# Patient Record
Sex: Male | Born: 1995 | Race: White | Hispanic: No | Marital: Single | Smoking: Never smoker
Health system: Southern US, Community
[De-identification: ages and names within clinical notes are randomized; demographics above are authoritative.]

## PROBLEM LIST (undated history)

## (undated) DIAGNOSIS — J45909 Unspecified asthma, uncomplicated: Secondary | ICD-10-CM

## (undated) HISTORY — PX: FOOT SURGERY: SHX648

## (undated) HISTORY — PX: WISDOM TOOTH EXTRACTION: SHX21

---
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
## 2015-03-09 ENCOUNTER — Emergency Department (INDEPENDENT_AMBULATORY_CARE_PROVIDER_SITE_OTHER)
Admission: EM | Admit: 2015-03-09 | Discharge: 2015-03-09 | Disposition: A | Discharge status: Home / Self Care | Payer: Self-pay | Source: Home / Self Care | Attending: Emergency Medicine | Admitting: Emergency Medicine

## 2015-03-09 ENCOUNTER — Encounter (HOSPITAL_COMMUNITY): Payer: Self-pay | Admitting: Emergency Medicine

## 2015-03-09 DIAGNOSIS — S161XXA Strain of muscle, fascia and tendon at neck level, initial encounter: Secondary | ICD-10-CM

## 2015-03-09 DIAGNOSIS — S060X0A Concussion without loss of consciousness, initial encounter: Secondary | ICD-10-CM

## 2015-03-09 MED ORDER — CYCLOBENZAPRINE HCL 10 MG PO TABS: 10.0000 mg | ORAL_TABLET | Freq: Three times a day (TID) | ORAL | Status: AC | PRN

## 2015-03-09 MED ORDER — IBUPROFEN 600 MG PO TABS: 600.0000 mg | ORAL_TABLET | Freq: Four times a day (QID) | ORAL | Status: AC | PRN
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
## 2015-03-09 NOTE — Discharge Instructions (Signed)
You have strained the muscles in your neck and throat. This is why it hurts when you swallow or move your head. Alternate ice and heat to the neck at least 3 times a day. Take ibuprofen 600 mg every 6 hours for the next 2 days. Use the Flexeril 3 times a day as needed for muscle spasms. This medicine will make you drowsy. It will likely take 2 weeks for your neck go back to normal.  If your balance has not returned to normal in 24 hours, please go to the emergency room for additional evaluation.
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
## 2015-10-09 ENCOUNTER — Ambulatory Visit (HOSPITAL_COMMUNITY)
Admission: EM | Admit: 2015-10-09 | Discharge: 2015-10-09 | Disposition: A | Discharge status: Home / Self Care | Payer: BLUE CROSS/BLUE SHIELD | Attending: Internal Medicine | Admitting: Internal Medicine

## 2015-10-09 ENCOUNTER — Encounter (HOSPITAL_COMMUNITY): Payer: Self-pay | Admitting: Emergency Medicine

## 2015-10-09 DIAGNOSIS — R110 Nausea: Secondary | ICD-10-CM

## 2015-10-09 DIAGNOSIS — K2970 Gastritis, unspecified, without bleeding: Secondary | ICD-10-CM | POA: Diagnosis not present

## 2015-10-09 MED ORDER — ONDANSETRON HCL 4 MG PO TABS: 4.0000 mg | ORAL_TABLET | Freq: Four times a day (QID) | ORAL | 0 refills | Status: DC

## 2015-10-09 NOTE — ED Triage Notes (Signed)
Pt. Stated, I've had cold sweats, headache, diarrhea, weakness,  and I think its happened because I was cleaning out my vapor with alcohol.  Im no sure,

## 2015-10-10 NOTE — ED Provider Notes (Signed)
CSN: [id number]     Arrival date & time 10/09/15  1916 History   First MD Initiated Contact with Patient 10/09/15 2058     Chief Complaint  Patient presents with  . Dizziness  . Excessive Sweating  . Diarrhea   (Consider location/radiation/quality/duration/timing/severity/associated sxs/prior Treatment) HPI 20 y/o male states he was "vaping" Friday night and inhaled salt from the cleaning solution. He states that he has an upset stomach with diarrhea for the last 24 hours. Girlfriend looked on Internet and they are concerned that he is salt overloaded at this time. Worried that he could have brain swelling. Anemia and other issues. Taking fluids and eating, but feels nauseated. 2 episodes of diarrhea today. Felt dizzy for a few minutes this morning when he got up. But better at this time.  Past Medical History:  Diagnosis Date  . Asthma    History reviewed. No pertinent surgical history. No family history on file. Social History  Substance Use Topics  . Smoking status: Never Smoker  . Smokeless tobacco: Never Used  . Alcohol use Yes    Review of Systems  Denies: HEADACHE, NAUSEA, ABDOMINAL PAIN, CHEST PAIN, CONGESTION, DYSURIA, SHORTNESS OF BREATH  Allergies  Oxycodone-acetaminophen; Tramadol; and Vicodin [hydrocodone-acetaminophen]  Home Medications   Prior to Admission medications   Medication Sig Start Date End Date Taking? Authorizing Provider  albuterol (PROVENTIL HFA;VENTOLIN HFA) 108 (90 BASE) MCG/ACT inhaler Inhale 2 puffs into the lungs every 6 (six) hours as needed for wheezing. 10/07/11   Ozella Rocksavid J Merrell, MD  cyclobenzaprine (FLEXERIL) 10 MG tablet Take 1 tablet (10 mg total) by mouth 3 (three) times daily as needed for muscle spasms. 03/09/15   Charm RingsErin J Honig, MD  ibuprofen (ADVIL,MOTRIN) 600 MG tablet Take 1 tablet (600 mg total) by mouth every 6 (six) hours as needed for moderate pain. 03/09/15   Charm RingsErin J Honig, MD  omeprazole (PRILOSEC) 40 MG capsule Take 1 capsule (40  mg total) by mouth daily. 10/07/11   Ozella Rocksavid J Merrell, MD  ondansetron (ZOFRAN) 4 MG tablet Take 1 tablet (4 mg total) by mouth every 6 (six) hours. 10/09/15   Tharon AquasFrank C Peighton Edgin, PA  Spacer/Aero-Holding Chambers DEVI 1 each by Does not apply route as directed. 10/07/11   Ozella Rocksavid J Merrell, MD   Meds Ordered and Administered this Visit  Medications - No data to display  BP 138/88 (BP Location: Left Arm)   Pulse 83   Temp 98.1 F (36.7 C) (Oral)   Resp 18   SpO2 100%  No data found.   Physical Exam NURSES NOTES AND VITAL SIGNS REVIEWED. CONSTITUTIONAL: Well developed, well nourished, no acute distress HEENT: normocephalic, atraumatic EYES: Conjunctiva normal NECK:normal ROM, supple, no adenopathy PULMONARY:No respiratory distress, normal effort ABDOMINAL: Soft, ND, NT BS+, No CVAT MUSCULOSKELETAL: Normal ROM of all extremities,  SKIN: warm and dry without rash PSYCHIATRIC: Mood and affect, behavior are normal  Urgent Care Course   Clinical Course   Long discussion with patient that he does not need to be worried at this time of hypernatremia. He has no neurological or physical signs of hypernatremia. Advised he most likely has a tummy bug and he will get better.  Procedures (including critical care time)  Labs Review Labs Reviewed - No data to display  Imaging Review No results found.   Visual Acuity Review  Right Eye Distance:   Left Eye Distance:   Bilateral Distance:    Right Eye Near:   Left Eye Near:  Bilateral Near:         MDM   1. Gastritis   2. Nausea      Patient is reassured that there are no issues that require transfer to higher level of care at this time or additional tests. Patient is advised to continue home symptomatic treatment. Patient is advised that if there are new or worsening symptoms to attend the emergency department, contact primary care provider, or return to UC. Instructions of care provided discharged home in stable  condition.    THIS NOTE WAS GENERATED USING A VOICE RECOGNITION SOFTWARE PROGRAM. ALL REASONABLE EFFORTS  WERE MADE TO PROOFREAD THIS DOCUMENT FOR ACCURACY.  I have verbally reviewed the discharge instructions with the patient. A printed AVS was given to the patient.  All questions were answered prior to discharge.      Tharon Aquas, PA 10/10/15 1103

## 2016-01-28 ENCOUNTER — Encounter (HOSPITAL_COMMUNITY): Payer: Self-pay

## 2016-01-28 ENCOUNTER — Emergency Department (HOSPITAL_COMMUNITY): Payer: BLUE CROSS/BLUE SHIELD

## 2016-01-28 ENCOUNTER — Emergency Department (HOSPITAL_COMMUNITY)
Admission: EM | Admit: 2016-01-28 | Discharge: 2016-01-28 | Disposition: A | Discharge status: Home / Self Care | Payer: BLUE CROSS/BLUE SHIELD | Attending: Emergency Medicine | Admitting: Emergency Medicine

## 2016-01-28 DIAGNOSIS — E860 Dehydration: Secondary | ICD-10-CM

## 2016-01-28 DIAGNOSIS — F419 Anxiety disorder, unspecified: Secondary | ICD-10-CM | POA: Diagnosis not present

## 2016-01-28 DIAGNOSIS — R42 Dizziness and giddiness: Secondary | ICD-10-CM | POA: Diagnosis present

## 2016-01-28 DIAGNOSIS — Z79899 Other long term (current) drug therapy: Secondary | ICD-10-CM | POA: Diagnosis not present

## 2016-01-28 DIAGNOSIS — J45909 Unspecified asthma, uncomplicated: Secondary | ICD-10-CM | POA: Diagnosis not present

## 2016-01-28 LAB — CBC
HEMATOCRIT: 48.4 % (ref 39.0–52.0)
Hemoglobin: 16.9 g/dL (ref 13.0–17.0)
MCH: 31.1 pg (ref 26.0–34.0)
MCHC: 34.9 g/dL (ref 30.0–36.0)
MCV: 89 fL (ref 78.0–100.0)
PLATELETS: 292 10*3/uL (ref 150–400)
RBC: 5.44 MIL/uL (ref 4.22–5.81)
RDW: 12.6 % (ref 11.5–15.5)
WBC: 11.8 10*3/uL — AB (ref 4.0–10.5)

## 2016-01-28 LAB — BASIC METABOLIC PANEL
Anion gap: 13 (ref 5–15)
CO2: 23 mmol/L (ref 22–32)
CREATININE: 0.98 mg/dL (ref 0.61–1.24)
Calcium: 10.1 mg/dL (ref 8.9–10.3)
Chloride: 102 mmol/L (ref 101–111)
Glucose, Bld: 124 mg/dL — ABNORMAL HIGH (ref 65–99)
POTASSIUM: 3.5 mmol/L (ref 3.5–5.1)
SODIUM: 138 mmol/L (ref 135–145)

## 2016-01-28 LAB — I-STAT TROPONIN, ED: Troponin i, poc: 0 ng/mL (ref 0.00–0.08)

## 2016-01-28 MED ORDER — ONDANSETRON HCL 4 MG/2ML IJ SOLN
4.0000 mg | Freq: Once | INTRAMUSCULAR | Status: AC
  Administered 2016-01-28: 4 mg via INTRAVENOUS
  Filled 2016-01-28: qty 2

## 2016-01-28 MED ORDER — SODIUM CHLORIDE 0.9 % IV SOLN
1000.0000 mL | Freq: Once | INTRAVENOUS | Status: AC
  Administered 2016-01-28: 1000 mL via INTRAVENOUS

## 2016-01-28 MED ORDER — ONDANSETRON HCL 4 MG PO TABS: 4.0000 mg | ORAL_TABLET | Freq: Three times a day (TID) | ORAL | 0 refills | Status: AC | PRN
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
## 2016-01-28 NOTE — Discharge Instructions (Signed)
It is very important that you establish with a primary care physician and have follow-up. Your CT head was normal.  I think you are very dehydrated and I think this was contributing to your symptoms.  I will prescribe Zofran which she may take as needed for nausea.
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
## 2016-01-28 NOTE — ED Notes (Signed)
Pt transported to CT ?

## 2016-01-28 NOTE — ED Notes (Signed)
UA at bedside if needed....
